# Patient Record
Sex: Female | Born: 2000 | Hispanic: Yes | Marital: Single | Smoking: Never smoker
Health system: Southern US, Community
[De-identification: ages and names within clinical notes are randomized; demographics above are authoritative.]

---
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
## 2019-03-23 IMAGING — CT CT ABD-PELV W/ CM
2 of 4 series · 16 of 46 positions shown, 18 images · IV contrast (omnipaque)
Comparison: MRI of the pelvis August 25, 2016

CLINICAL DATA: Left lower quadrant suprapubic pain. No bowel
movement days.

EXAM:
CT ABDOMEN AND PELVIS WITH CONTRAST
TECHNIQUE: Multidetector CT imaging of the abdomen and pelvis was performed
using the standard protocol following bolus administration of
intravenous contrast.
CONTRAST:  100mL OMNIPAQUE IOHEXOL 300 MG/ML  SOLN

[Series 3: a/p w/ 5mm · axial · 0.75mm/px · z∈[+588,+1023]mm · 13 of 95 slices shown, 15 images]
[im 4/95  soft-tissue]
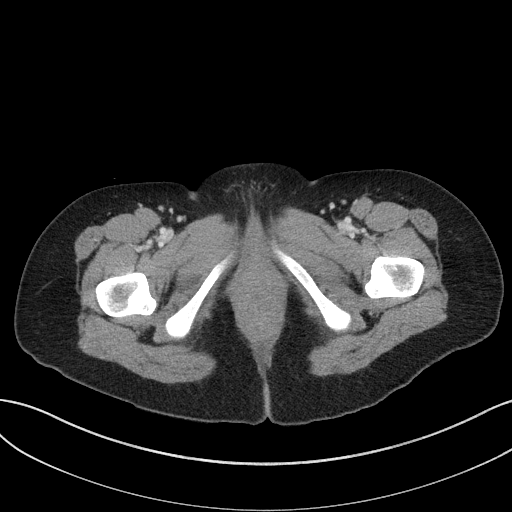
[im 4/95  bone]
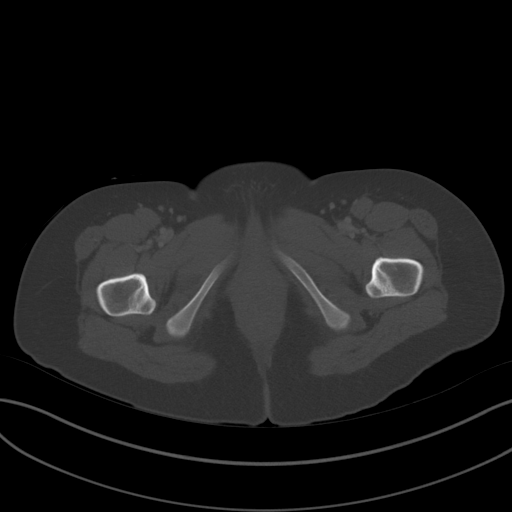
[im 12/95  soft-tissue]
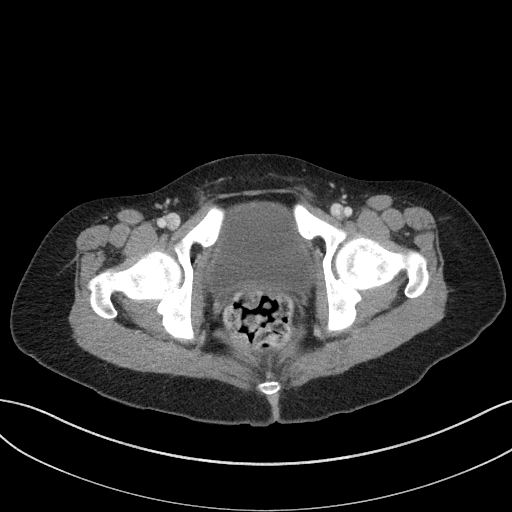
[im 19/95  soft-tissue]
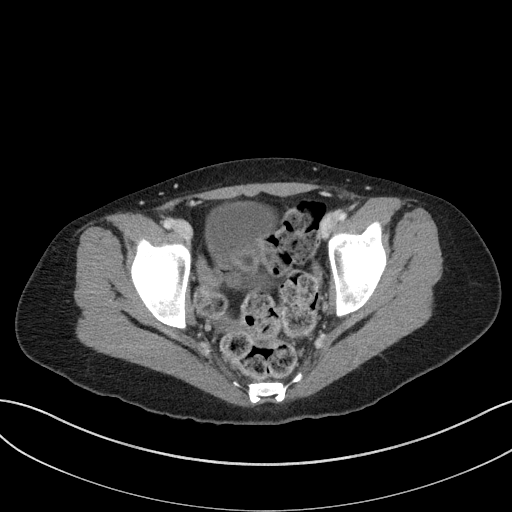
[im 27/95  soft-tissue]
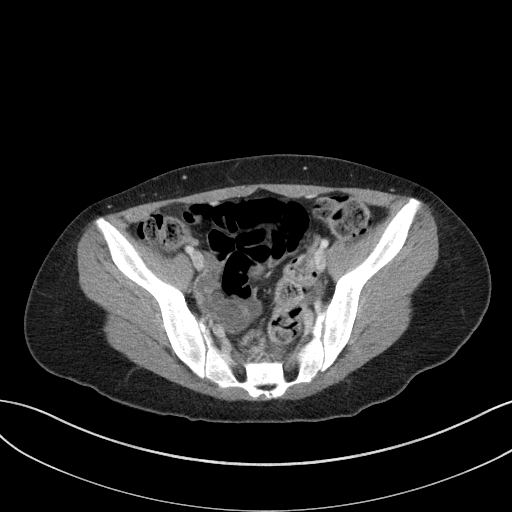
[im 34/95  soft-tissue]
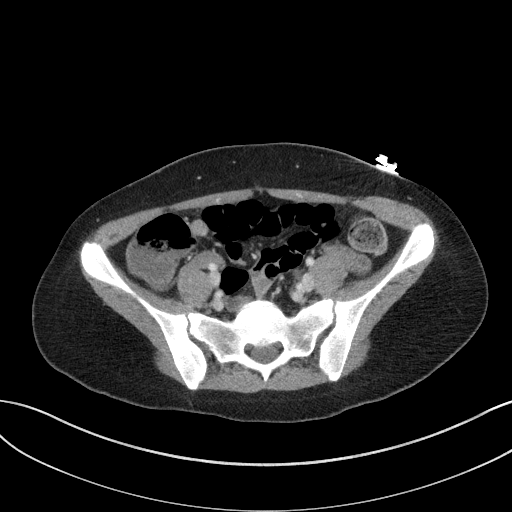
[im 42/95  soft-tissue]
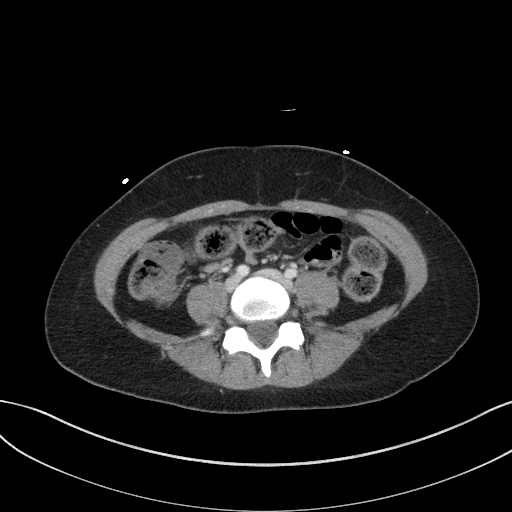
[im 49/95  soft-tissue]
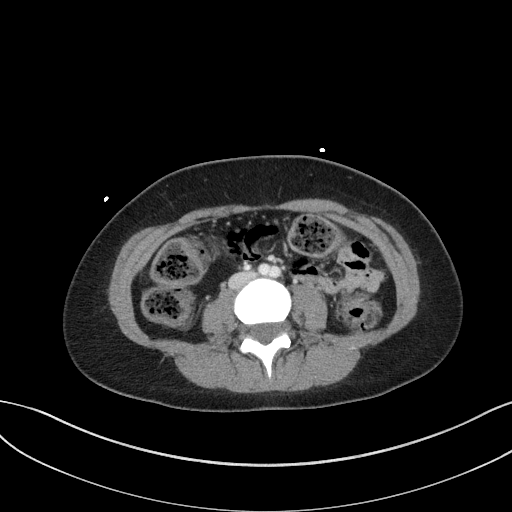
[im 53/95  soft-tissue]
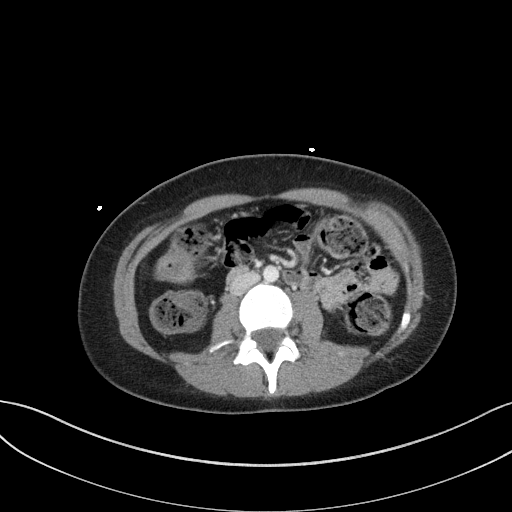
[im 61/95  soft-tissue]
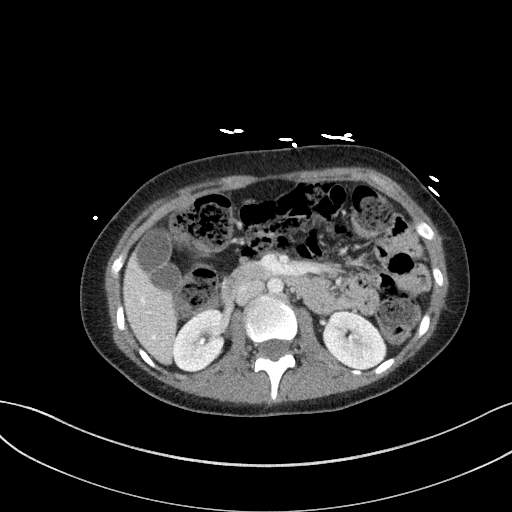
[im 61/95  bone]
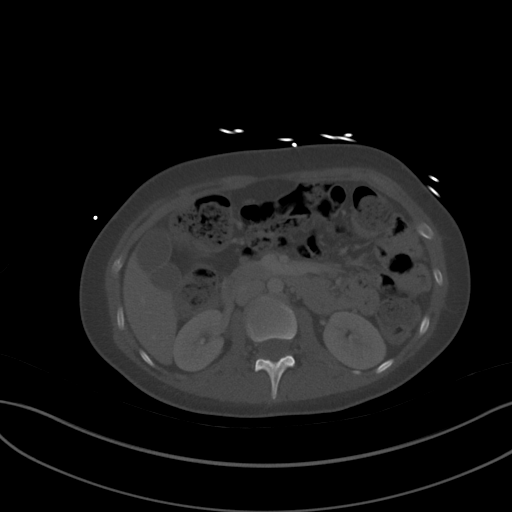
[im 68/95  soft-tissue]
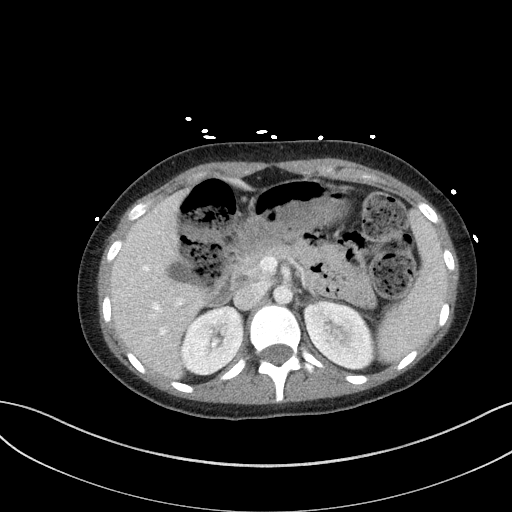
[im 76/95  soft-tissue]
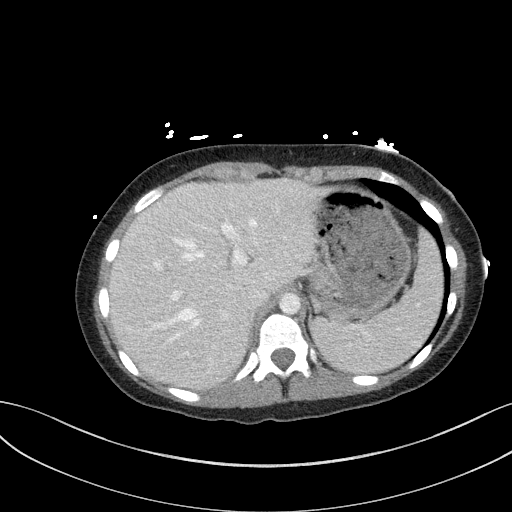
[im 83/95  soft-tissue]
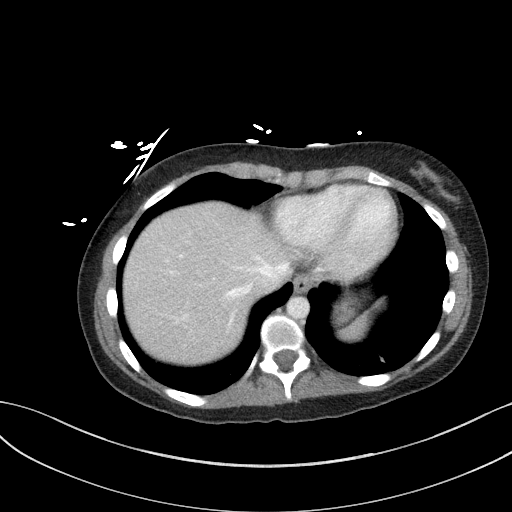
[im 91/95  soft-tissue]
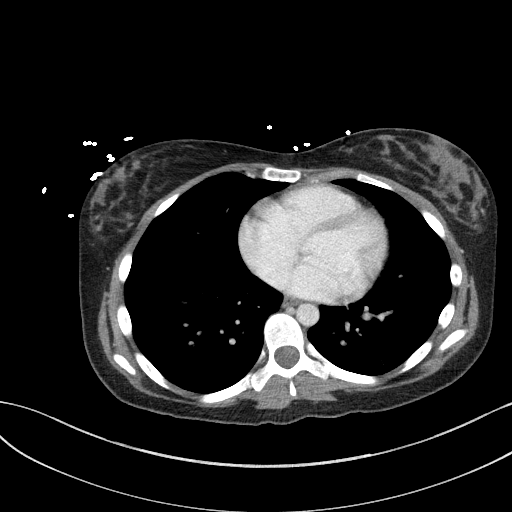

[Series 6: a/p w/ cor · coronal · 0.71mm/px · 3 of 148 slices shown]
[im 50/148  soft-tissue]
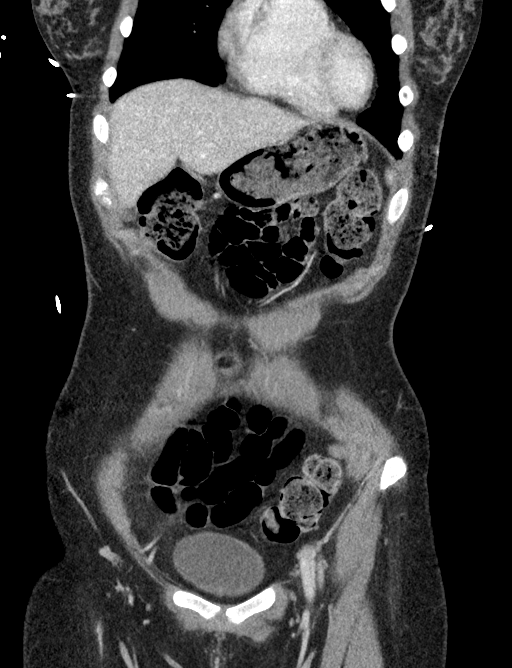
[im 66/148  soft-tissue]
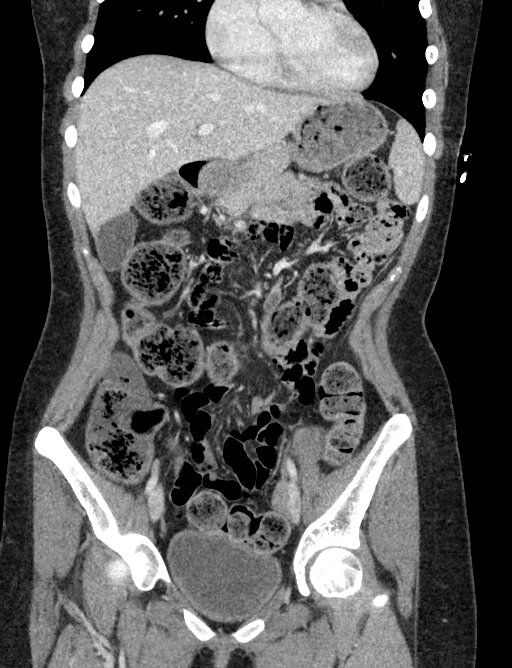
[im 82/148  soft-tissue]
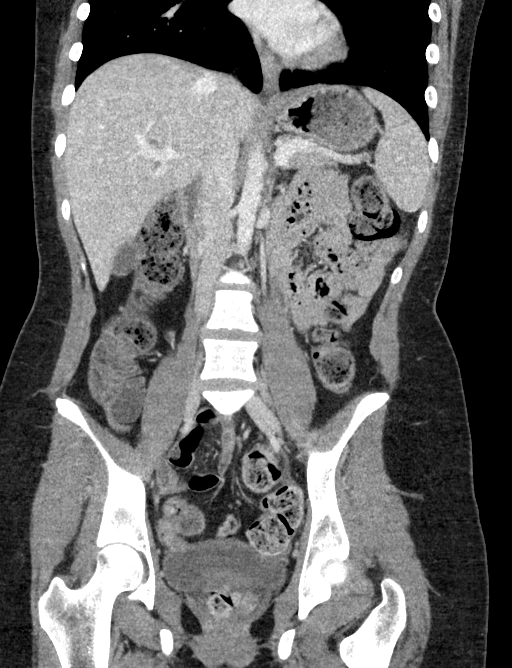

[16 of 46 positions shown; findings below may reference images not displayed]

FINDINGS: Lower chest: No acute abnormality.

Hepatobiliary: No focal liver abnormality is seen. No gallstones,
gallbladder wall thickening, or biliary dilatation.

Pancreas: Unremarkable. No pancreatic ductal dilatation or
surrounding inflammatory changes.

Spleen: Normal in size without focal abnormality.

Adrenals/Urinary Tract: Adrenal glands are unremarkable. Kidneys are
normal, without renal calculi, focal lesion, or hydronephrosis.
Bladder is unremarkable.

Stomach/Bowel: Stomach and small bowel are normal. Moderate to
severe fecal loading seen throughout the length of the colon. The
appendix is normal.

Vascular/Lymphatic: No significant vascular findings are present. No
enlarged abdominal or pelvic lymph nodes.

Reproductive: No uterus identified.  No ovarian mass is noted.

Other: No abdominal wall hernia or abnormality. No abdominopelvic
ascites.

Musculoskeletal: No acute or significant osseous findings.
IMPRESSION: 1. Moderate to severe fecal loading throughout the colon.
2. No uterus identified, consistent with history.
3. No other abnormalities.

## 2019-07-26 DIAGNOSIS — Z20822 Contact with and (suspected) exposure to covid-19: Secondary | ICD-10-CM | POA: Diagnosis not present

## 2019-07-26 DIAGNOSIS — Z03818 Encounter for observation for suspected exposure to other biological agents ruled out: Secondary | ICD-10-CM | POA: Diagnosis not present

## 2019-11-09 DIAGNOSIS — J342 Deviated nasal septum: Secondary | ICD-10-CM | POA: Diagnosis not present

## 2019-11-09 DIAGNOSIS — M950 Acquired deformity of nose: Secondary | ICD-10-CM | POA: Diagnosis not present

## 2019-11-09 DIAGNOSIS — J343 Hypertrophy of nasal turbinates: Secondary | ICD-10-CM | POA: Diagnosis not present

## 2019-12-31 DIAGNOSIS — M950 Acquired deformity of nose: Secondary | ICD-10-CM | POA: Diagnosis not present

## 2019-12-31 DIAGNOSIS — J343 Hypertrophy of nasal turbinates: Secondary | ICD-10-CM | POA: Diagnosis not present

## 2019-12-31 DIAGNOSIS — J342 Deviated nasal septum: Secondary | ICD-10-CM | POA: Diagnosis not present

## 2020-01-03 DIAGNOSIS — M950 Acquired deformity of nose: Secondary | ICD-10-CM | POA: Diagnosis not present

## 2020-01-03 DIAGNOSIS — J3489 Other specified disorders of nose and nasal sinuses: Secondary | ICD-10-CM | POA: Diagnosis not present

## 2020-01-03 DIAGNOSIS — S022XXA Fracture of nasal bones, initial encounter for closed fracture: Secondary | ICD-10-CM | POA: Diagnosis not present

## 2020-01-03 DIAGNOSIS — J343 Hypertrophy of nasal turbinates: Secondary | ICD-10-CM | POA: Diagnosis not present

## 2020-01-03 DIAGNOSIS — J342 Deviated nasal septum: Secondary | ICD-10-CM | POA: Diagnosis not present

## 2022-12-31 ENCOUNTER — Ambulatory Visit
Admission: EM | Admit: 2022-12-31 | Discharge: 2022-12-31 | Disposition: A | Discharge status: Home / Self Care | Payer: 59 | Attending: Family Medicine | Admitting: Family Medicine

## 2022-12-31 DIAGNOSIS — R110 Nausea: Secondary | ICD-10-CM

## 2022-12-31 DIAGNOSIS — R519 Headache, unspecified: Secondary | ICD-10-CM

## 2022-12-31 MED ORDER — ONDANSETRON HCL 4 MG PO TABS: 4.0000 mg | ORAL_TABLET | Freq: Four times a day (QID) | ORAL | 0 refills | Status: AC

## 2022-12-31 MED ORDER — TIZANIDINE HCL 4 MG PO TABS: 4.0000 mg | ORAL_TABLET | Freq: Four times a day (QID) | ORAL | 0 refills | Status: AC | PRN

## 2022-12-31 MED ORDER — ONDANSETRON 4 MG PO TBDP
4.0000 mg | ORAL_TABLET | Freq: Once | ORAL | Status: AC
  Administered 2022-12-31: 4 mg via ORAL

## 2022-12-31 MED ORDER — KETOROLAC TROMETHAMINE 30 MG/ML IJ SOLN
30.0000 mg | Freq: Once | INTRAMUSCULAR | Status: AC
  Administered 2022-12-31: 30 mg via INTRAMUSCULAR
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
